# Patient Record
Sex: Female | Born: 1950 | Race: White | Hispanic: No | Smoking: Never smoker
Health system: Southern US, Community
[De-identification: ages and names within clinical notes are randomized; demographics above are authoritative.]

---

## 2010-04-22 ENCOUNTER — Ambulatory Visit
Admission: RE | Admit: 2010-04-22 | Discharge: 2010-04-22 | Payer: Self-pay | Source: Home / Self Care | Attending: Gynecologic Oncology | Admitting: Gynecologic Oncology

## 2010-04-22 ENCOUNTER — Other Ambulatory Visit
Admission: RE | Admit: 2010-04-22 | Discharge: 2010-04-22 | Payer: Self-pay | Source: Home / Self Care | Admitting: Gynecologic Oncology

## 2019-06-02 ENCOUNTER — Ambulatory Visit: Payer: Self-pay | Attending: Internal Medicine

## 2019-06-02 DIAGNOSIS — Z23 Encounter for immunization: Secondary | ICD-10-CM | POA: Insufficient documentation

## 2019-06-02 NOTE — Progress Notes (Signed)
   Covid-19 Vaccination Clinic  Name:  Mia Burns    MRN: [id number] DOB: 10-Dec-1950  06/02/2019  [name] was observed post Covid-19 immunization for 15 minutes without incidence. She was provided with Vaccine Information Sheet and instruction to access the V-Safe system.   [name] was instructed to call 911 with any severe reactions post vaccine: Marland Kitchen Difficulty breathing  . Swelling of your face and throat  . A fast heartbeat  . A bad rash all over your body  . Dizziness and weakness    Immunizations Administered    Name Date Dose VIS Date Route   Pfizer COVID-19 Vaccine 06/02/2019 12:51 PM 0.3 mL 03/30/2019 Intramuscular   Manufacturer: [employer], [employer]   Lot: [id number]   NDC: [address]-0

## 2019-06-23 ENCOUNTER — Encounter (HOSPITAL_COMMUNITY): Payer: Self-pay | Admitting: Emergency Medicine

## 2019-06-23 ENCOUNTER — Emergency Department (HOSPITAL_COMMUNITY): Payer: Medicare PPO

## 2019-06-23 ENCOUNTER — Emergency Department (HOSPITAL_COMMUNITY)
Admission: EM | Admit: 2019-06-23 | Discharge: 2019-06-23 | Disposition: A | Discharge status: Home / Self Care | Payer: Medicare PPO | Attending: Emergency Medicine | Admitting: Emergency Medicine

## 2019-06-23 ENCOUNTER — Other Ambulatory Visit: Payer: Self-pay

## 2019-06-23 DIAGNOSIS — R070 Pain in throat: Secondary | ICD-10-CM | POA: Insufficient documentation

## 2019-06-23 DIAGNOSIS — R1310 Dysphagia, unspecified: Secondary | ICD-10-CM | POA: Insufficient documentation

## 2019-06-23 DIAGNOSIS — R42 Dizziness and giddiness: Secondary | ICD-10-CM | POA: Insufficient documentation

## 2019-06-23 LAB — CBC WITH DIFFERENTIAL/PLATELET
Abs Immature Granulocytes: 0.01 10*3/uL (ref 0.00–0.07)
Basophils Absolute: 0.1 10*3/uL (ref 0.0–0.1)
Basophils Relative: 1 %
Eosinophils Absolute: 0.1 10*3/uL (ref 0.0–0.5)
Eosinophils Relative: 1 %
HCT: 44.6 % (ref 36.0–46.0)
Hemoglobin: 14.7 g/dL (ref 12.0–15.0)
Immature Granulocytes: 0 %
Lymphocytes Relative: 26 %
Lymphs Abs: 1.6 10*3/uL (ref 0.7–4.0)
MCH: 30.2 pg (ref 26.0–34.0)
MCHC: 33 g/dL (ref 30.0–36.0)
MCV: 91.6 fL (ref 80.0–100.0)
Monocytes Absolute: 0.5 10*3/uL (ref 0.1–1.0)
Monocytes Relative: 8 %
Neutro Abs: 3.9 10*3/uL (ref 1.7–7.7)
Neutrophils Relative %: 64 %
Platelets: 309 10*3/uL (ref 150–400)
RBC: 4.87 MIL/uL (ref 3.87–5.11)
RDW: 11.9 % (ref 11.5–15.5)
WBC: 6.1 10*3/uL (ref 4.0–10.5)
nRBC: 0 % (ref 0.0–0.2)

## 2019-06-23 LAB — COMPREHENSIVE METABOLIC PANEL
ALT: 25 U/L (ref 0–44)
AST: 22 U/L (ref 15–41)
Albumin: 4.2 g/dL (ref 3.5–5.0)
Alkaline Phosphatase: 49 U/L (ref 38–126)
Anion gap: 7 (ref 5–15)
BUN: 23 mg/dL (ref 8–23)
CO2: 24 mmol/L (ref 22–32)
Calcium: 9.2 mg/dL (ref 8.9–10.3)
Chloride: 109 mmol/L (ref 98–111)
Creatinine, Ser: 0.58 mg/dL (ref 0.44–1.00)
GFR calc Af Amer: >60 mL/min (ref >60)
GFR calc non Af Amer: >60 mL/min (ref >60)
Glucose, Bld: 120 mg/dL — ABNORMAL HIGH (ref 70–99)
Potassium: 3.9 mmol/L (ref 3.5–5.1)
Sodium: 140 mmol/L (ref 135–145)
Total Bilirubin: 0.5 mg/dL (ref 0.3–1.2)
Total Protein: 6.6 g/dL (ref 6.5–8.1)

## 2019-06-23 LAB — TROPONIN I (HIGH SENSITIVITY): Troponin I (High Sensitivity): 2 ng/L (ref <18)

## 2019-06-23 LAB — GROUP A STREP BY PCR: Group A Strep by PCR: NOT DETECTED

## 2019-06-23 LAB — D-DIMER, QUANTITATIVE: D-Dimer, Quant: 0.25 ug/mL-FEU (ref 0.00–0.50)

## 2019-06-23 MED ORDER — METHYLPREDNISOLONE 4 MG PO TBPK: ORAL_TABLET | ORAL | 0 refills | Status: AC

## 2019-06-23 MED ORDER — LIDOCAINE VISCOUS HCL 2 % MT SOLN
15.0000 mL | Freq: Once | OROMUCOSAL | Status: DC
  Filled 2019-06-23: qty 15

## 2019-06-23 MED ORDER — OMEPRAZOLE 20 MG PO CPDR: 20.0000 mg | DELAYED_RELEASE_CAPSULE | Freq: Every day | ORAL | 0 refills | Status: AC

## 2019-06-23 MED ORDER — ALUM & MAG HYDROXIDE-SIMETH 200-200-20 MG/5ML PO SUSP
30.0000 mL | Freq: Once | ORAL | Status: DC
  Filled 2019-06-23: qty 30

## 2019-06-23 MED ORDER — SODIUM CHLORIDE (PF) 0.9 % IJ SOLN
INTRAMUSCULAR | Status: AC
  Filled 2019-06-23: qty 50

## 2019-06-23 MED ORDER — IOHEXOL 300 MG/ML  SOLN
75.0000 mL | Freq: Once | INTRAMUSCULAR | Status: AC | PRN
  Administered 2019-06-23: 75 mL via INTRAVENOUS

## 2019-06-23 NOTE — ED Notes (Signed)
Pt preferred not to get back to cardiac monitoring. MD& Nurse Notified.

## 2019-06-23 NOTE — ED Provider Notes (Signed)
[facility] DEPT Provider Note   CSN: [id number] Arrival date & time: 06/23/19  0303     History Chief Complaint  Patient presents with  . Sore Throat  . Dizziness    Mia Burns is a 69 y.o. female.  Patient here with difficulty swallowing.  States this has been ongoing for the past 3 weeks.  She has not had it evaluated.  She describes like something stuck in her throat and has difficulty and pain when she tries to swallow.  Today she woke from sleep acutely and felt like she could not swallow and began to panic.  She does have a history of anxiety but does not take anxiety medication.  She started to feel lightheaded and dizzy and became concerned so she came to the hospital.  She is now feeling somewhat better.  She is able to swallow her secretions without difficulty.  She denies any nausea or vomiting.  States throughout the course of her difficulty swallowing she is able to eat and drink just fine.  She denies any chest pain or shortness of breath.  She is had intermittent right-sided upper back pain for the past month or so after "roughhousing" with her daughter.  She had some pain in that area this morning as well which is resolved.  She denies any cough or fever.  Denies any cardiac history.  Reports no chronic medical problems and takes no medications.  She wonders if she has acid reflux due to discomfort in her throat.  She has not had any cough, runny nose, fever, chest pain or shortness of breath.  The history is provided by the patient.  Sore Throat Pertinent negatives include no abdominal pain, no headaches and no shortness of breath.  Dizziness Associated symptoms: no headaches, no shortness of breath, no vomiting and no weakness        History reviewed. No pertinent past medical history.  There are no problems to display for this patient.   History reviewed. No pertinent surgical history.   OB History   No obstetric history on  file.     History reviewed. No pertinent family history.  Social History   Tobacco Use  . Smoking status: Never Smoker  . Smokeless tobacco: Never Used  Substance Use Topics  . Alcohol use: Never  . Drug use: Never    Home Medications Prior to Admission medications   Not on File    Allergies    Patient has no known allergies.  Review of Systems   Review of Systems  Constitutional: Negative for activity change, appetite change, fatigue and fever.  HENT: Positive for sore throat and trouble swallowing. Negative for congestion and rhinorrhea.   Respiratory: Negative for cough, chest tightness and shortness of breath.   Gastrointestinal: Negative for abdominal pain, constipation and vomiting.  Genitourinary: Negative for dysuria and hematuria.  Musculoskeletal: Negative for arthralgias and myalgias.  Skin: Negative for rash.  Neurological: Positive for dizziness and light-headedness. Negative for weakness and headaches.  Psychiatric/Behavioral: The patient is nervous/anxious.    all other systems are negative except as noted in the HPI and PMH.    Physical Exam Updated Vital Signs BP 137/80 (BP Location: Left Arm)   Pulse 72   Temp 98.5 F (36.9 C) (Oral)   Resp 15   Ht 5\' 6"  (1.676 m)   Wt 59 kg   SpO2 100%   BMI 20.98 kg/m   Physical Exam Vitals and nursing note reviewed.  Constitutional:      General: She is not in acute distress.    Appearance: She is well-developed.     Comments: Anxious, speaking full sentences  HENT:     Head: Normocephalic and atraumatic.     Mouth/Throat:     Pharynx: No oropharyngeal exudate.     Comments: Controlling secretions.  Oropharynx appears normal.  Uvula is midline.  No tongue or lip swelling. Eyes:     Conjunctiva/sclera: Conjunctivae normal.     Pupils: Pupils are equal, round, and reactive to light.  Neck:     Comments: No meningismus. No palpable masses Cardiovascular:     Rate and Rhythm: Normal rate and  regular rhythm.     Heart sounds: Normal heart sounds. No murmur.  Pulmonary:     Effort: Pulmonary effort is normal. No respiratory distress.     Breath sounds: Normal breath sounds.  Chest:     Chest wall: No tenderness.  Abdominal:     Palpations: Abdomen is soft.     Tenderness: There is no abdominal tenderness. There is no guarding or rebound.  Musculoskeletal:        General: Tenderness present. Normal range of motion.     Cervical back: Normal range of motion and neck supple. No rigidity.     Comments: R paraspinal thoracic tenderness. No midline tenderness  Lymphadenopathy:     Cervical: No cervical adenopathy.  Skin:    General: Skin is warm.     Capillary Refill: Capillary refill takes less than 2 seconds.  Neurological:     General: No focal deficit present.     Mental Status: She is alert and oriented to person, place, and time. Mental status is at baseline.     Cranial Nerves: No cranial nerve deficit.     Motor: No abnormal muscle tone.     Coordination: Coordination normal.     Comments: No ataxia on finger to nose bilaterally. No pronator drift. 5/5 strength throughout. CN 2-12 intact.Equal grip strength. Sensation intact.   Psychiatric:        Behavior: Behavior normal.     ED Results / Procedures / Treatments   Labs (all labs ordered are listed, but only abnormal results are displayed) Labs Reviewed  COMPREHENSIVE METABOLIC PANEL - Abnormal; Notable for the following components:      Result Value   Glucose, Bld 120 (*)    All other components within normal limits  GROUP A STREP BY PCR  CBC WITH DIFFERENTIAL/PLATELET  D-DIMER, QUANTITATIVE (NOT AT [facility])  TROPONIN I (HIGH SENSITIVITY)    EKG EKG Interpretation  Date/Time:  Saturday June 23 2019 04:50:05 EST Ventricular Rate:  70 PR Interval:    QRS Duration: 88 QT Interval:  392 QTC Calculation: 423 R Axis:   34 Text Interpretation: Sinus rhythm Probable left atrial enlargement No previous ECGs  available Artifact Confirmed by Glynn Octave [phone]) on 06/23/2019 4:54:09 AM   Radiology DG Neck Soft Tissue  Result Date: 06/23/2019 CLINICAL DATA:  69 year old female with sensation of something stuck in her throat. EXAM: NECK SOFT TISSUES - 1+ VIEW COMPARISON:  No priors. FINDINGS: There is no evidence of retropharyngeal soft tissue swelling or epiglottic enlargement. The cervical airway is unremarkable and no radio-opaque foreign body identified. IMPRESSION: Negative. Electronically Signed   By: Trudie Reed M.D.   On: 06/23/2019 04:08   DG Chest 2 View  Result Date: 06/23/2019 CLINICAL DATA:  69 year old female with history of back pain. Dizziness. EXAM:  CHEST - 2 VIEW COMPARISON:  No priors. FINDINGS: Lung volumes are normal. No consolidative airspace disease. No pleural effusions. No pneumothorax. No pulmonary nodule or mass noted. Pulmonary vasculature and the cardiomediastinal silhouette are within normal limits. IMPRESSION: No radiographic evidence of acute cardiopulmonary disease. Electronically Signed   By: Trudie Reed M.D.   On: 06/23/2019 04:07   CT Soft Tissue Neck W Contrast  Result Date: 06/23/2019 CLINICAL DATA:  Dysphagia EXAM: CT NECK WITH CONTRAST TECHNIQUE: Multidetector CT imaging of the neck was performed using the standard protocol following the bolus administration of intravenous contrast. CONTRAST:  67mL OMNIPAQUE IOHEXOL 300 MG/ML  SOLN COMPARISON:  None. FINDINGS: Pharynx and larynx: Streak artifact is present through the oral cavity and oropharynx. Low-attenuation small lesion within the right vallecula may reflect a cyst. There is no associated suspicious enhancing soft tissue. Otherwise unremarkable. Salivary glands: Parotid and submandibular glands are unremarkable. Thyroid: Tiny right thyroid nodule. No further follow-up is required. Lymph nodes: None enlarged or abnormal density. Vascular: Major neck vessels are patent. Limited intracranial: No abnormal  enhancement. Visualized orbits: No significant abnormality. Mastoids and visualized paranasal sinuses: Aerated. Skeleton: Mild cervical spine degenerative changes. Upper chest: Included upper lungs are clear. Other: None. IMPRESSION: Small subcentimeter lesion within the right vallecula, which may reflect a cyst. Unclear if this is source of reported symptoms. Direct visual inspection could be considered. Otherwise unremarkable study. Electronically Signed   By: Guadlupe Spanish M.D.   On: 06/23/2019 07:32    Procedures Procedures (including critical care time)  Medications Ordered in ED Medications  alum & mag hydroxide-simeth (MAALOX/MYLANTA) 200-200-20 MG/5ML suspension 30 mL (has no administration in time range)    And  lidocaine (XYLOCAINE) 2 % viscous mouth solution 15 mL (has no administration in time range)    ED Course  I have reviewed the triage vital signs and the nursing notes.  Pertinent labs & imaging results that were available during my care of the patient were reviewed by me and considered in my medical decision making (see chart for details).    MDM Rules/Calculators/A&P                     3 weeks of painful swallowing and difficulty with swallowing though able to tolerate p.o. without difficulty.  She is very anxious.  Her oropharynx exam is normal.  EKG is sinus rhythm.  Chest x-ray is negative and neck soft tissue x-ray is negative.  She is tolerating her secretions without difficulty.  Labs are reassuring with negative troponin and D-dimer.  Low suspicion for ACS or PE.  With her recurrent dysphagia may consider acid reflux disease and will start PPI.  Avoid alcohol, caffeine, NSAIDs, spicy foods.  Patient still insistent that there feels like a foreign body in the back of her throat.  She has however tolerating secretions without difficulty and did have some improvement with GI cocktail.  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  Discussed with ENT Dr. Annalee Genta who agrees with outpatient follow-up and feels a course of steroids would be reasonable.  Discussed return precautions with the patient.  Return to the ED with difficulty swallowing, difficulty breathing, chest pain or shortness of breath or any other concerns.  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 Final Clinical  Impression(s) / ED Diagnoses Final diagnoses:  Dysphagia, unspecified type    Rx / DC Orders ED Discharge Orders    None       Keatyn Luck, Jeannett Senior, MD 06/23/19 [phone]

## 2019-06-23 NOTE — Discharge Instructions (Addendum)
Your testing is reassuring.  There is no foreign body seen in your throat.  Your symptoms may be due to acid reflux and/or anxiety.  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  There is no evidence of heart attack or blood clot in the lung. Start taking the thought medicine that we talked about.  Avoid alcohol, caffeine, NSAIDs, spicy foods. You can follow-up with your primary doctor as well as the ENT doctor for further evaluation of the cyst.  The ENT doctor also suggested taking the a course of steroids to help with inflammation. This was sent to your pharmacy as well.  Return to the ED if you develop worsening symptoms including not able to swallow your spit, not able to eat or drink, difficulty breathing, severe pain or any other concerns.

## 2019-06-23 NOTE — ED Triage Notes (Signed)
Patient complaining of something stuck in her throat. Patient also states that she is starting to feel dizzy.

## 2019-06-26 ENCOUNTER — Ambulatory Visit: Payer: Self-pay | Attending: Internal Medicine

## 2019-06-26 DIAGNOSIS — Z23 Encounter for immunization: Secondary | ICD-10-CM | POA: Insufficient documentation

## 2019-06-26 NOTE — Progress Notes (Signed)
   Covid-19 Vaccination Clinic  Name:  Mia Burns    MRN: [id number] DOB: 1951/03/12  06/26/2019  Mia Burns was observed post Covid-19 immunization for 30 minutes based on pre-vaccination screening without incident. She was provided with Vaccine Information Sheet and instruction to access the V-Safe system.   Mia Burns was instructed to call 911 with any severe reactions post vaccine: Marland Kitchen Difficulty breathing  . Swelling of face and throat  . A fast heartbeat  . A bad rash all over body  . Dizziness and weakness   Immunizations Administered    Name Date Dose VIS Date Route   Pfizer COVID-19 Vaccine 06/26/2019 12:34 PM 0.3 mL 03/30/2019 Intramuscular   Manufacturer: [employer], [employer]   Lot: [id number]   NDC: [address]-1

## 2020-04-09 ENCOUNTER — Emergency Department (HOSPITAL_COMMUNITY)
Admission: EM | Admit: 2020-04-09 | Discharge: 2020-04-09 | Disposition: A | Discharge status: Home / Self Care | Payer: Medicare PPO | Attending: Emergency Medicine | Admitting: Emergency Medicine

## 2020-04-09 ENCOUNTER — Encounter (HOSPITAL_COMMUNITY): Payer: Self-pay | Admitting: Emergency Medicine

## 2020-04-09 DIAGNOSIS — Y92410 Unspecified street and highway as the place of occurrence of the external cause: Secondary | ICD-10-CM | POA: Insufficient documentation

## 2020-04-09 DIAGNOSIS — S60211A Contusion of right wrist, initial encounter: Secondary | ICD-10-CM | POA: Insufficient documentation

## 2020-04-09 DIAGNOSIS — Z5321 Procedure and treatment not carried out due to patient leaving prior to being seen by health care provider: Secondary | ICD-10-CM | POA: Diagnosis not present

## 2020-04-09 DIAGNOSIS — M545 Low back pain, unspecified: Secondary | ICD-10-CM | POA: Diagnosis present

## 2020-04-09 NOTE — ED Notes (Signed)
Pt is leaving. 
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
## 2020-05-12 IMAGING — CR DG NECK SOFT TISSUE
2 series · 2 of 2 positions shown · non-contrast
Comparison: No priors.

CLINICAL DATA: 68-year-old female with sensation of something stuck
in her throat.

EXAM:
NECK SOFT TISSUES - 1+ VIEW

[w soft tissue neck lat]
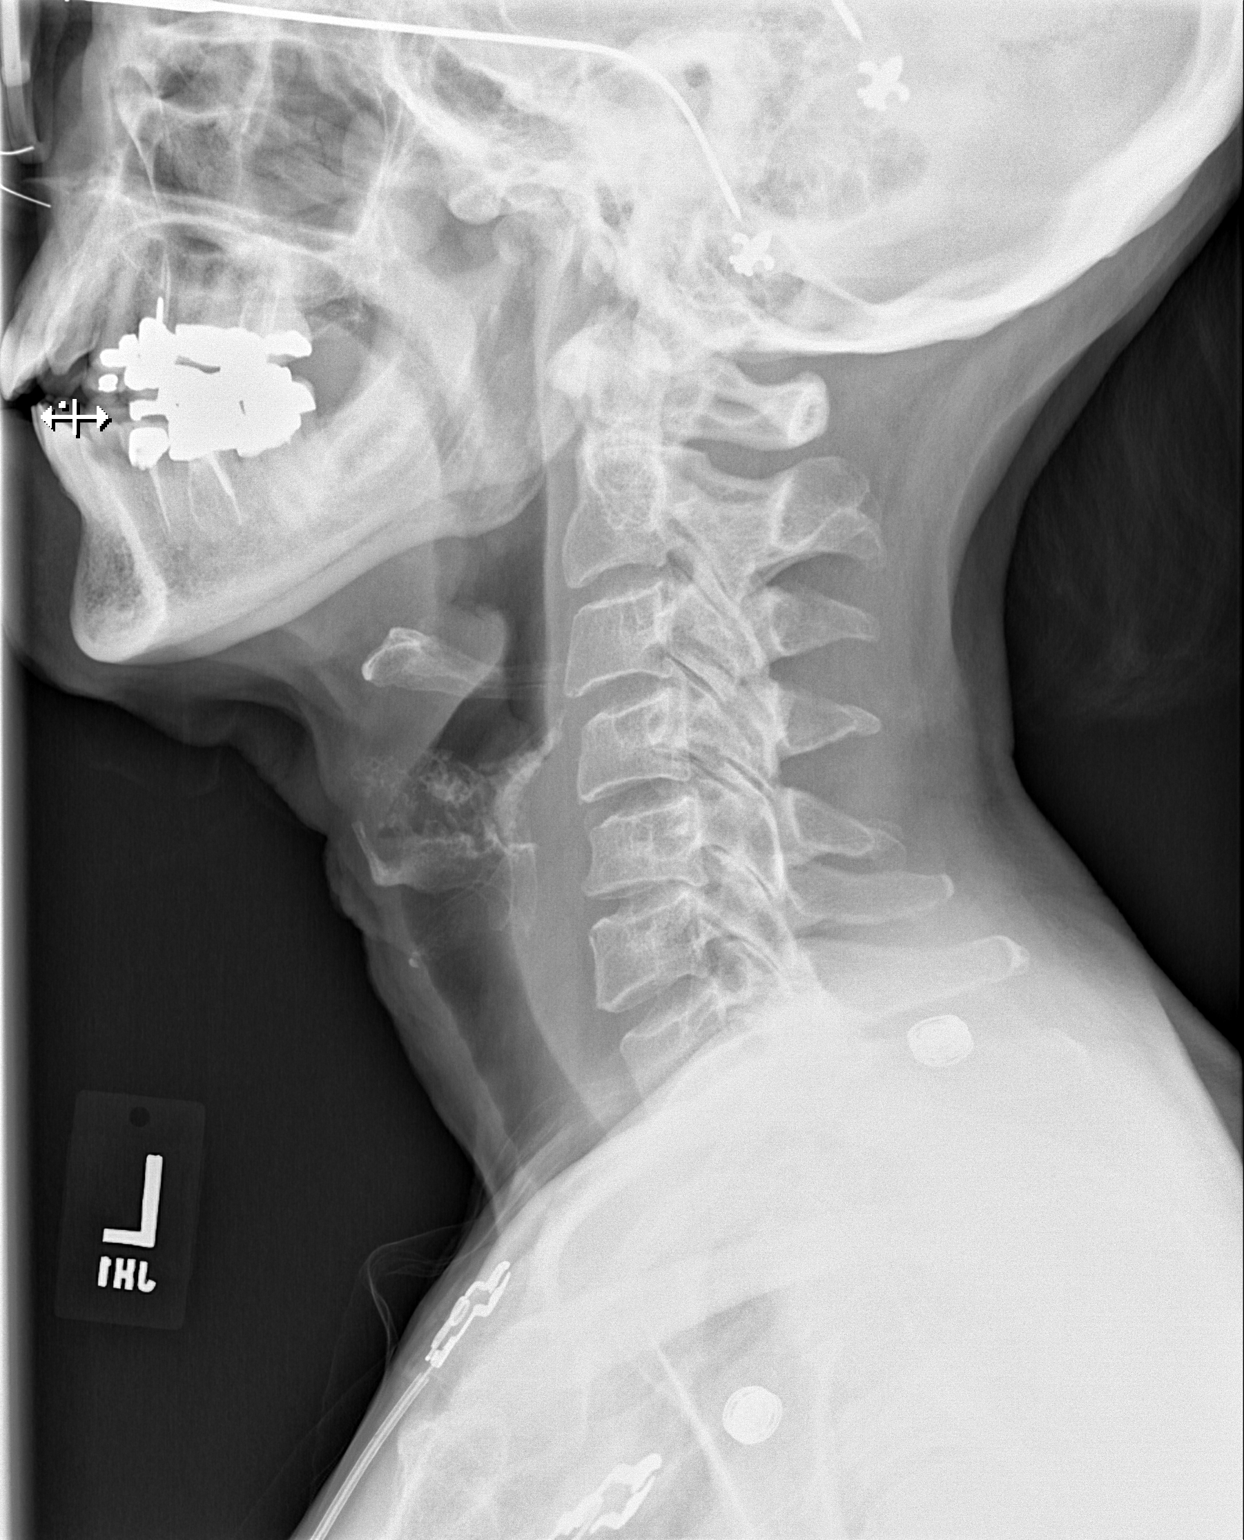

[w soft tissue neck ap]
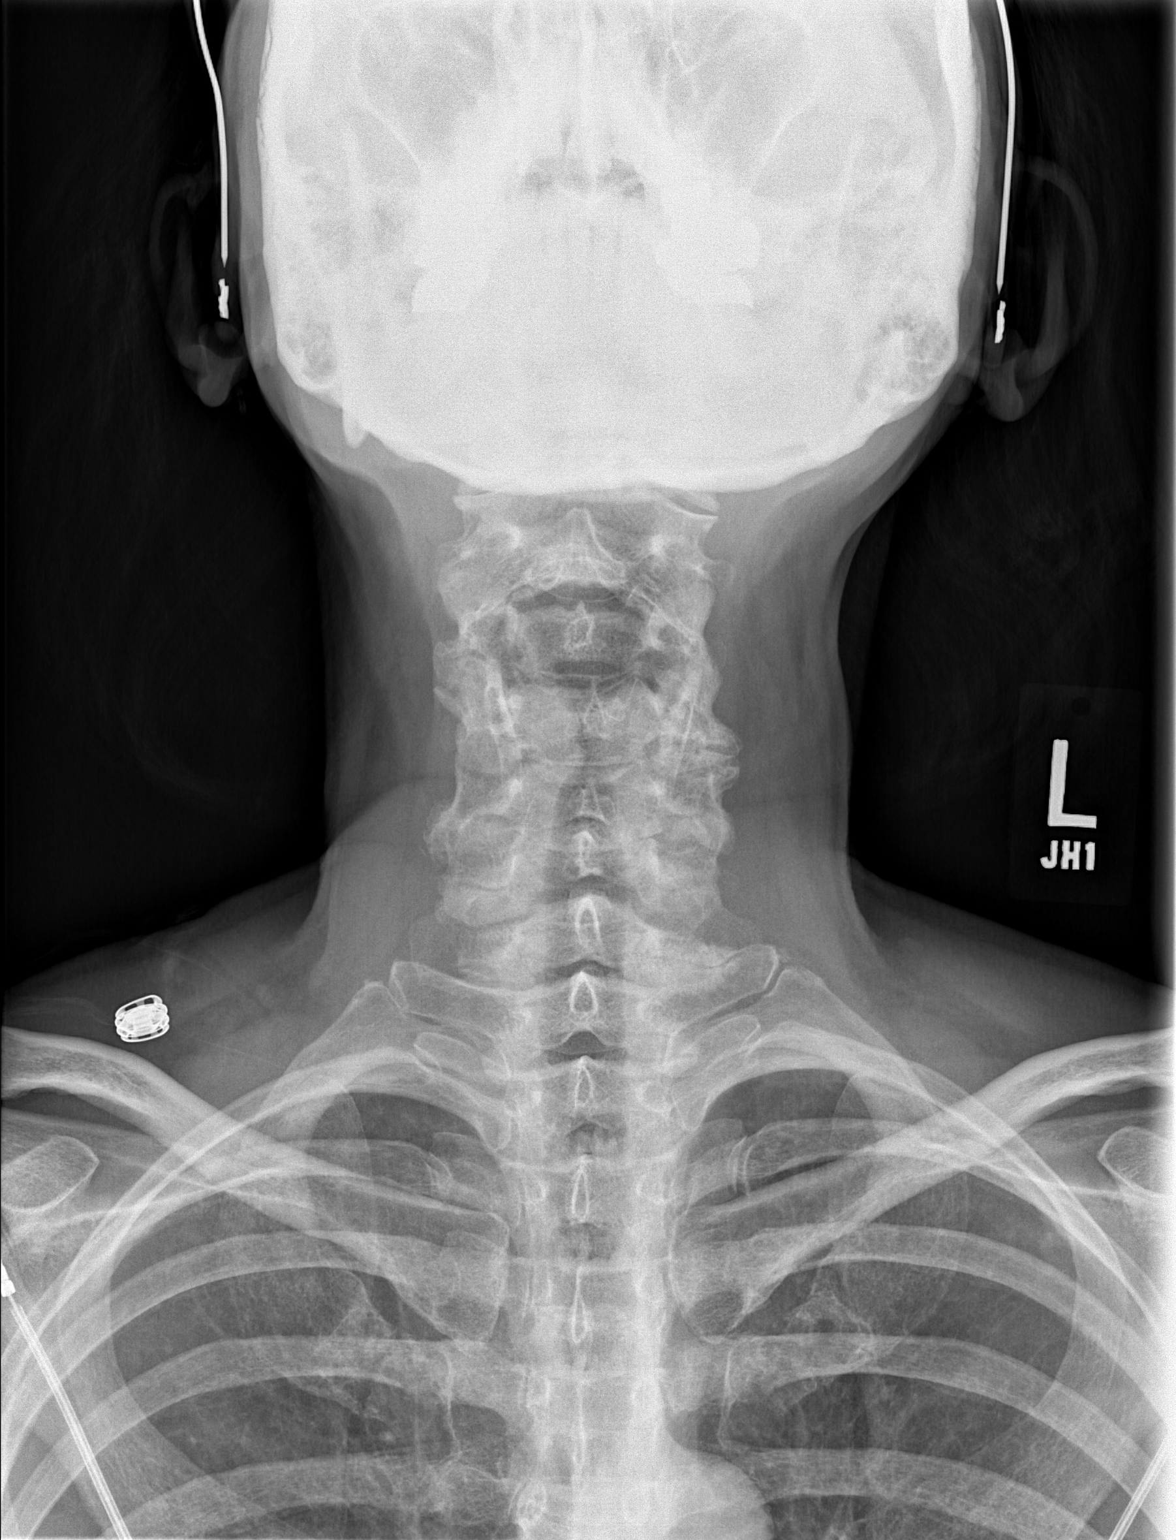

[2 of 2 positions shown; findings below may reference images not displayed]

FINDINGS: There is no evidence of retropharyngeal soft tissue swelling or
epiglottic enlargement. The cervical airway is unremarkable and no
radio-opaque foreign body identified.
IMPRESSION: Negative.

## 2020-05-12 IMAGING — CT CT NECK W/ CM
4 series · 15 of 33 positions shown, 18 images · IV contrast (omnipaque)
Comparison: None.

CLINICAL DATA: Dysphagia

EXAM:
CT NECK WITH CONTRAST
TECHNIQUE: Multidetector CT imaging of the neck was performed using the
standard protocol following the bolus administration of intravenous
contrast.
CONTRAST:  75mL OMNIPAQUE IOHEXOL 300 MG/ML  SOLN

[Series 2: axial neck · axial · 0.56mm/px · z∈[-245,-85]mm · 5 of 118 slices shown, 7 images]
[im 20/118  soft-tissue]
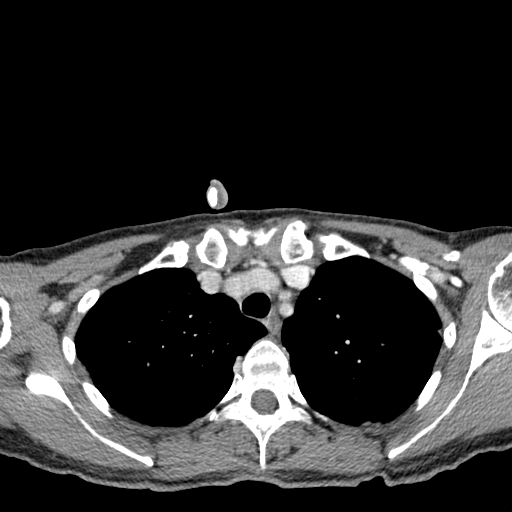
[im 20/118  bone]
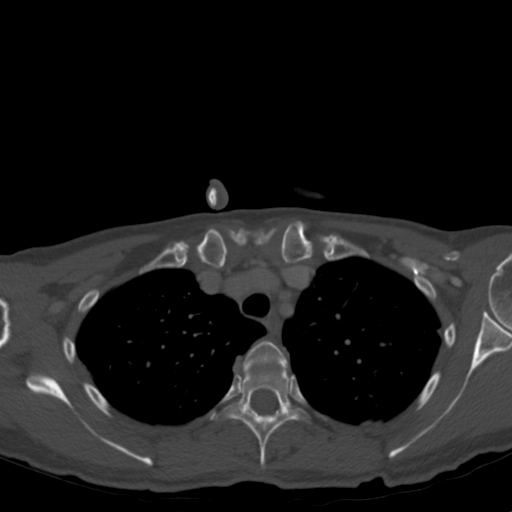
[im 40/118  bone]
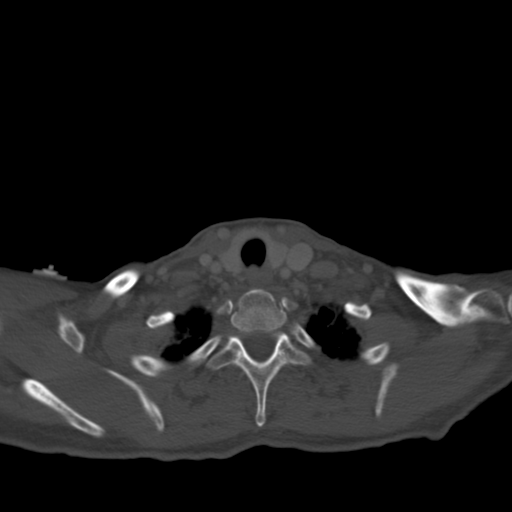
[im 59/118  bone]
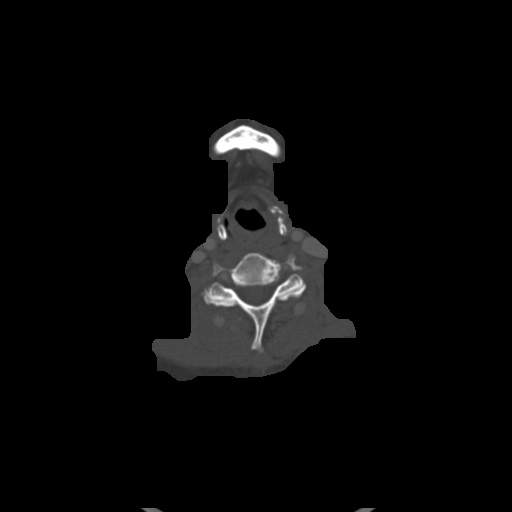
[im 79/118  bone]
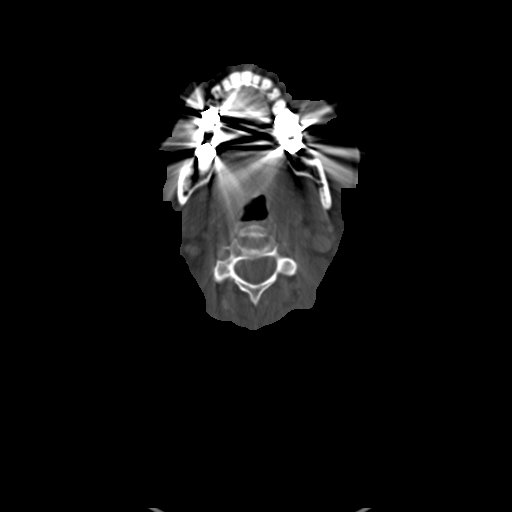
[im 98/118  soft-tissue]
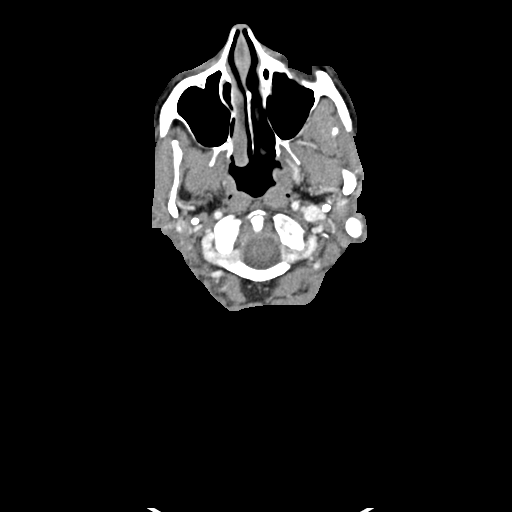
[im 98/118  bone]
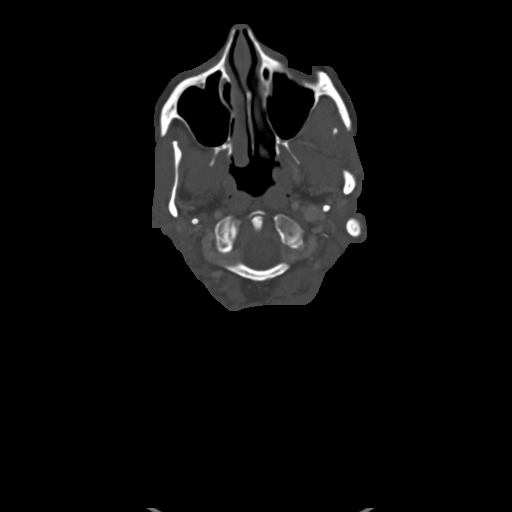

[Series 4: axial · axial · 0.50mm/px · z∈[-304,-265]mm · 2 of 131 slices shown]
[im 22/131  bone]
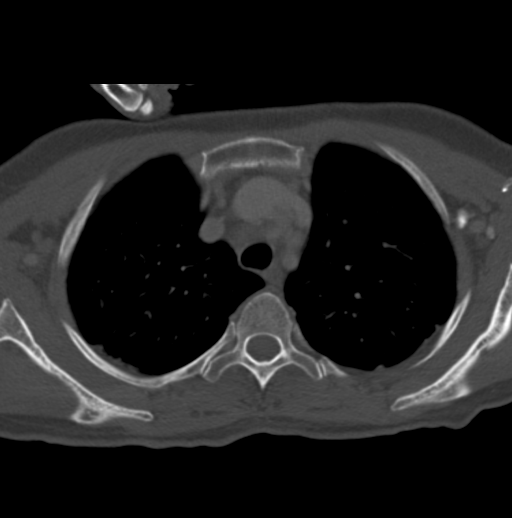
[im 44/131  bone]
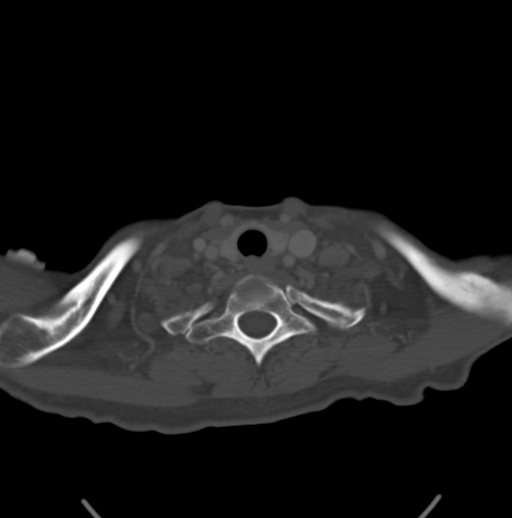

[Series 5: coronal · coronal · 0.52mm/px · 3 of 81 slices shown]
[im 26/81  bone]
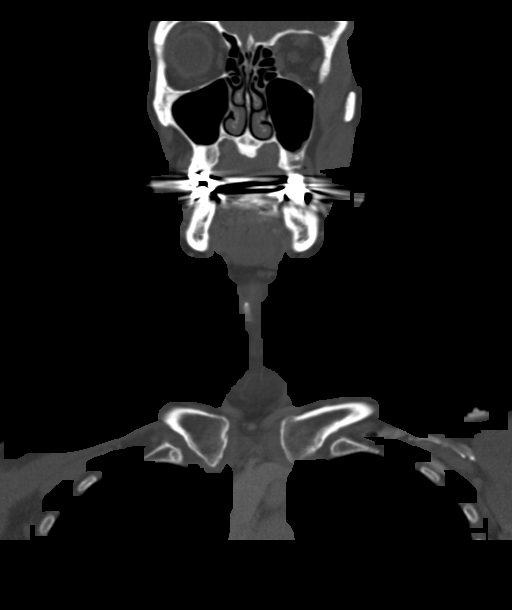
[im 36/81  bone]
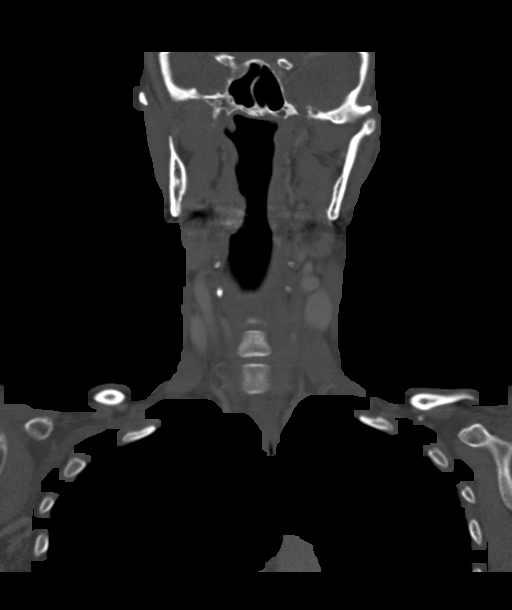
[im 45/81  bone]
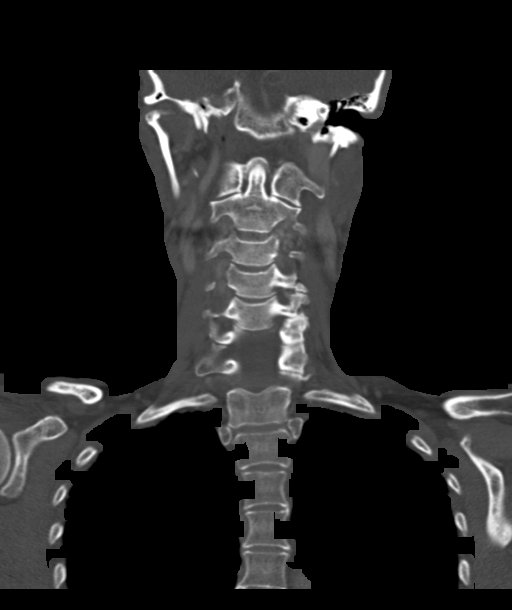

[Series 6: sagittal · sagittal · 0.52mm/px · 5 of 77 slices shown, 6 images]
[im 26/77  bone]
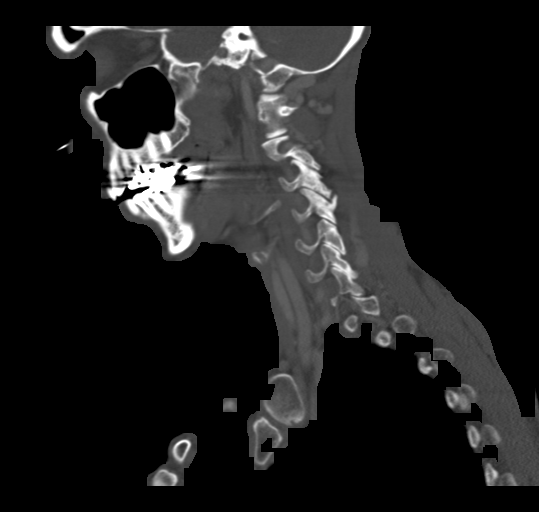
[im 32/77  bone]
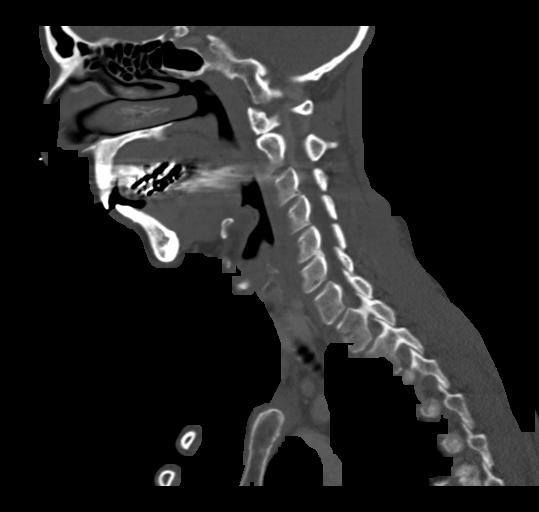
[im 39/77  soft-tissue]
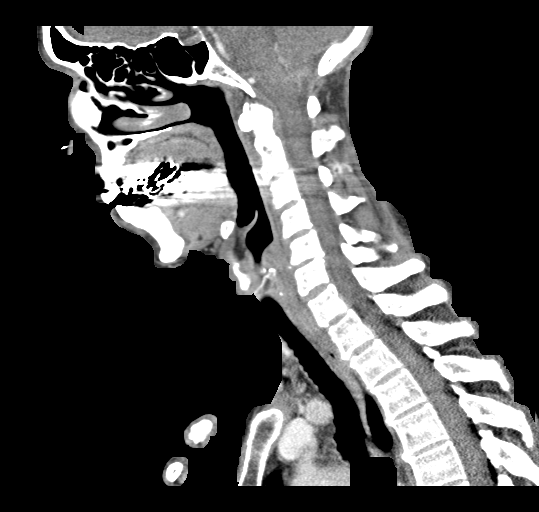
[im 39/77  bone]
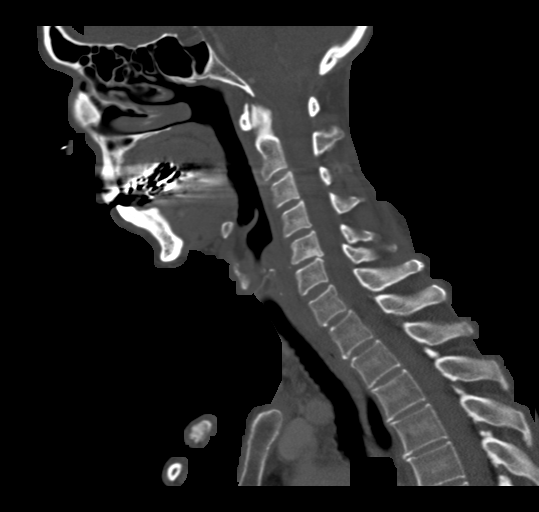
[im 45/77  bone]
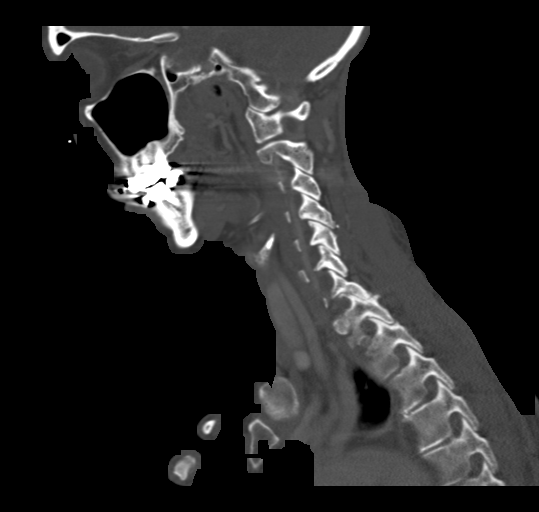
[im 51/77  bone]
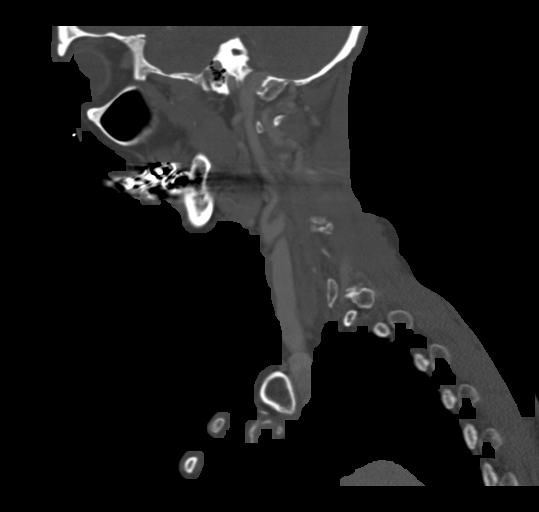

[15 of 33 positions shown; findings below may reference images not displayed]

FINDINGS: Pharynx and larynx: Streak artifact is present through the oral
cavity and oropharynx. Low-attenuation small lesion within the right
vallecula may reflect a cyst. There is no associated suspicious
enhancing soft tissue. Otherwise unremarkable.

Salivary glands: Parotid and submandibular glands are unremarkable.

Thyroid: Tiny right thyroid nodule. No further follow-up is
required.

Lymph nodes: None enlarged or abnormal density.

Vascular: Major neck vessels are patent.

Limited intracranial: No abnormal enhancement.

Visualized orbits: No significant abnormality.

Mastoids and visualized paranasal sinuses: Aerated.

Skeleton: Mild cervical spine degenerative changes.

Upper chest: Included upper lungs are clear.

Other: None.
IMPRESSION: Small subcentimeter lesion within the right vallecula, which may
reflect a cyst. Unclear if this is source of reported symptoms.
Direct visual inspection could be considered.

Otherwise unremarkable study.
